# Patient Record
Sex: Female | Born: 1950 | Hispanic: No | Smoking: Never smoker
Health system: Southern US, Community
[De-identification: ages and names within clinical notes are randomized; demographics above are authoritative.]

---

## 2005-06-15 ENCOUNTER — Other Ambulatory Visit: Admission: RE | Admit: 2005-06-15 | Discharge: 2005-06-15 | Payer: Self-pay | Admitting: Family Medicine

## 2005-07-05 ENCOUNTER — Encounter: Admission: RE | Admit: 2005-07-05 | Discharge: 2005-07-05 | Payer: Self-pay | Admitting: Family Medicine

## 2006-07-31 ENCOUNTER — Encounter: Admission: RE | Admit: 2006-07-31 | Discharge: 2006-07-31 | Payer: Self-pay | Admitting: Family Medicine

## 2007-08-04 ENCOUNTER — Encounter: Admission: RE | Admit: 2007-08-04 | Discharge: 2007-08-04 | Payer: Self-pay | Admitting: Family Medicine

## 2008-09-15 ENCOUNTER — Encounter: Admission: RE | Admit: 2008-09-15 | Discharge: 2008-09-15 | Payer: Self-pay | Admitting: Family Medicine

## 2009-10-03 ENCOUNTER — Encounter: Admission: RE | Admit: 2009-10-03 | Discharge: 2009-10-03 | Payer: Self-pay | Admitting: Family Medicine

## 2010-10-06 ENCOUNTER — Encounter
Admission: RE | Admit: 2010-10-06 | Discharge: 2010-10-06 | Payer: Self-pay | Source: Home / Self Care | Attending: Family Medicine | Admitting: Family Medicine

## 2010-11-12 ENCOUNTER — Encounter: Payer: Self-pay | Admitting: Family Medicine

## 2011-09-05 ENCOUNTER — Other Ambulatory Visit: Payer: Self-pay | Admitting: Family Medicine

## 2011-09-05 DIAGNOSIS — Z1231 Encounter for screening mammogram for malignant neoplasm of breast: Secondary | ICD-10-CM

## 2011-10-08 ENCOUNTER — Ambulatory Visit
Admission: RE | Admit: 2011-10-08 | Discharge: 2011-10-08 | Disposition: A | Discharge status: Home / Self Care | Payer: No Typology Code available for payment source | Source: Ambulatory Visit | Attending: Family Medicine | Admitting: Family Medicine

## 2011-10-08 DIAGNOSIS — Z1231 Encounter for screening mammogram for malignant neoplasm of breast: Secondary | ICD-10-CM

## 2011-12-29 ENCOUNTER — Ambulatory Visit (INDEPENDENT_AMBULATORY_CARE_PROVIDER_SITE_OTHER): Payer: No Typology Code available for payment source | Admitting: Family Medicine

## 2011-12-29 VITALS — BP 114/61 | HR 80 | Temp 98.2°F | Resp 16 | Ht 64.5 in | Wt 132.0 lb

## 2011-12-29 DIAGNOSIS — L259 Unspecified contact dermatitis, unspecified cause: Secondary | ICD-10-CM

## 2011-12-29 DIAGNOSIS — L309 Dermatitis, unspecified: Secondary | ICD-10-CM

## 2011-12-29 MED ORDER — CEPHALEXIN 500 MG PO CAPS: 500.0000 mg | ORAL_CAPSULE | Freq: Three times a day (TID) | ORAL | Status: AC

## 2011-12-29 MED ORDER — PREDNISONE 20 MG PO TABS: ORAL_TABLET | ORAL | Status: AC

## 2011-12-29 MED ORDER — CLOBETASOL PROPIONATE 0.05 % EX CREA: TOPICAL_CREAM | Freq: Two times a day (BID) | CUTANEOUS | Status: DC

## 2011-12-29 NOTE — Patient Instructions (Addendum)
Only take brief showers, and then use some mineral oil as directed. Use the clobetasol cream once or twice daily, only in very small amounts. Take the oral medications as directed.  Return in one week  Eczema Atopic dermatitis, or eczema, is an inherited type of sensitive skin. Often people with eczema have a family history of allergies, asthma, or hay fever. It causes a red itchy rash and dry scaly skin. The itchiness may occur before the skin rash and may be very intense. It is not contagious. Eczema is generally worse during the cooler winter months and often improves with the warmth of summer. Eczema usually starts showing signs in infancy. Some children outgrow eczema, but it may last through adulthood. Flare-ups may be caused by:  Eating something or contact with something you are sensitive or allergic to.   Stress.  DIAGNOSIS  The diagnosis of eczema is usually based upon symptoms and medical history. TREATMENT  Eczema cannot be cured, but symptoms usually can be controlled with treatment or avoidance of allergens (things to which you are sensitive or allergic to).  Controlling the itching and scratching.   Use over-the-counter antihistamines as directed for itching. It is especially useful at night when the itching tends to be worse.   Use over-the-counter steroid creams as directed for itching.   Scratching makes the rash and itching worse and may cause impetigo (a skin infection) if fingernails are contaminated (dirty).   Keeping the skin well moisturized with creams every day. This will seal in moisture and help prevent dryness. Lotions containing alcohol and water can dry the skin and are not recommended.   Limiting exposure to allergens.   Recognizing situations that cause stress.   Developing a plan to manage stress.  HOME CARE INSTRUCTIONS   Take prescription and over-the-counter medicines as directed by your caregiver.   Do not use anything on the skin without  checking with your caregiver.   Keep baths or showers short (5 minutes) in warm (not hot) water. Use mild cleansers for bathing. You may add non-perfumed bath oil to the bath water. It is best to avoid soap and bubble bath.   Immediately after a bath or shower, when the skin is still damp, apply a moisturizing ointment to the entire body. This ointment should be a petroleum ointment. This will seal in moisture and help prevent dryness. The thicker the ointment the better. These should be unscented.   Keep fingernails cut short and wash hands often. If your child has eczema, it may be necessary to put soft gloves or mittens on your child at night.   Dress in clothes made of cotton or cotton blends. Dress lightly, as heat increases itching.   Avoid foods that may cause flare-ups. Common foods include cow's milk, peanut butter, eggs and wheat.   Keep a child with eczema away from anyone with fever blisters. The virus that causes fever blisters (herpes simplex) can cause a serious skin infection in children with eczema.  SEEK MEDICAL CARE IF:   Itching interferes with sleep.   The rash gets worse or is not better within one week following treatment.   The rash looks infected (pus or soft yellow scabs).   You or your child has an oral temperature above 102 F (38.9 C).   Your baby is older than 3 months with a rectal temperature of 100.5 F (38.1 C) or higher for more than 1 day.   The rash flares up after contact with someone  who has fever blisters.  SEEK IMMEDIATE MEDICAL CARE IF:   Your baby is older than 3 months with a rectal temperature of 102 F (38.9 C) or higher.   Your baby is older than 3 months or younger with a rectal temperature of 100.4 F (38 C) or higher.  Document Released: 10/05/2000 Document Revised: 09/27/2011 Document Reviewed: 08/10/2009 [facility] Patient Information 2012 [place], [place].

## 2011-12-29 NOTE — Progress Notes (Signed)
Subjective: Patient has had problems with eczema since she was a child in [place]. However it was much milder until the last several years. This is the worst it's been. In the last week it's gotten much worse. She has splotches of eczema on her back and trunk, on her extremities, especially bad on her shins. It itches her terribly. She's been using some triamcinolone cream without success. She tried soaking in the bathtub with some sea salt. Otherwise healthy.  Objective: Extensive eczema as noted above denies any other types of allergies.  Assessment eczema dermatitis  Plan: Prednisone Clobetasol Short shower, post shower mineral oil Keflex

## 2012-01-24 ENCOUNTER — Other Ambulatory Visit: Payer: Self-pay | Admitting: Family Medicine

## 2015-11-28 DIAGNOSIS — Z961 Presence of intraocular lens: Secondary | ICD-10-CM | POA: Diagnosis not present

## 2015-12-12 DIAGNOSIS — M654 Radial styloid tenosynovitis [de Quervain]: Secondary | ICD-10-CM | POA: Diagnosis not present

## 2015-12-12 DIAGNOSIS — M79642 Pain in left hand: Secondary | ICD-10-CM | POA: Diagnosis not present

## 2016-02-13 DIAGNOSIS — Z0000 Encounter for general adult medical examination without abnormal findings: Secondary | ICD-10-CM | POA: Diagnosis not present

## 2016-02-13 DIAGNOSIS — Z131 Encounter for screening for diabetes mellitus: Secondary | ICD-10-CM | POA: Diagnosis not present

## 2016-02-13 DIAGNOSIS — Z13820 Encounter for screening for osteoporosis: Secondary | ICD-10-CM | POA: Diagnosis not present

## 2016-02-13 DIAGNOSIS — Z1239 Encounter for other screening for malignant neoplasm of breast: Secondary | ICD-10-CM | POA: Diagnosis not present

## 2016-02-13 DIAGNOSIS — Z13220 Encounter for screening for lipoid disorders: Secondary | ICD-10-CM | POA: Diagnosis not present

## 2016-02-14 ENCOUNTER — Other Ambulatory Visit: Payer: Self-pay

## 2016-02-14 DIAGNOSIS — Z1231 Encounter for screening mammogram for malignant neoplasm of breast: Secondary | ICD-10-CM

## 2016-02-15 DIAGNOSIS — Z131 Encounter for screening for diabetes mellitus: Secondary | ICD-10-CM | POA: Diagnosis not present

## 2016-02-15 DIAGNOSIS — Z136 Encounter for screening for cardiovascular disorders: Secondary | ICD-10-CM | POA: Diagnosis not present

## 2016-03-01 ENCOUNTER — Ambulatory Visit
Admission: RE | Admit: 2016-03-01 | Discharge: 2016-03-01 | Disposition: A | Discharge status: Home / Self Care | Payer: Medicare Other | Source: Ambulatory Visit

## 2016-03-01 DIAGNOSIS — Z1231 Encounter for screening mammogram for malignant neoplasm of breast: Secondary | ICD-10-CM

## 2016-03-02 ENCOUNTER — Other Ambulatory Visit: Payer: Self-pay | Admitting: Physician Assistant

## 2016-03-02 DIAGNOSIS — R928 Other abnormal and inconclusive findings on diagnostic imaging of breast: Secondary | ICD-10-CM

## 2016-03-06 ENCOUNTER — Ambulatory Visit
Admission: RE | Admit: 2016-03-06 | Discharge: 2016-03-06 | Disposition: A | Discharge status: Home / Self Care | Payer: Medicare Other | Source: Ambulatory Visit | Attending: Physician Assistant | Admitting: Physician Assistant

## 2016-03-06 DIAGNOSIS — R928 Other abnormal and inconclusive findings on diagnostic imaging of breast: Secondary | ICD-10-CM

## 2016-03-06 DIAGNOSIS — R922 Inconclusive mammogram: Secondary | ICD-10-CM | POA: Diagnosis not present

## 2016-03-13 DIAGNOSIS — E2839 Other primary ovarian failure: Secondary | ICD-10-CM | POA: Diagnosis not present

## 2016-03-13 DIAGNOSIS — M8589 Other specified disorders of bone density and structure, multiple sites: Secondary | ICD-10-CM | POA: Diagnosis not present

## 2016-11-26 DIAGNOSIS — Z961 Presence of intraocular lens: Secondary | ICD-10-CM | POA: Diagnosis not present

## 2017-06-11 DIAGNOSIS — Z0000 Encounter for general adult medical examination without abnormal findings: Secondary | ICD-10-CM | POA: Diagnosis not present

## 2017-06-11 DIAGNOSIS — M85851 Other specified disorders of bone density and structure, right thigh: Secondary | ICD-10-CM | POA: Diagnosis not present

## 2017-06-11 DIAGNOSIS — E785 Hyperlipidemia, unspecified: Secondary | ICD-10-CM | POA: Diagnosis not present

## 2017-12-02 DIAGNOSIS — Z961 Presence of intraocular lens: Secondary | ICD-10-CM | POA: Diagnosis not present

## 2018-12-03 DIAGNOSIS — Z961 Presence of intraocular lens: Secondary | ICD-10-CM | POA: Diagnosis not present

## 2018-12-08 DIAGNOSIS — Z0000 Encounter for general adult medical examination without abnormal findings: Secondary | ICD-10-CM | POA: Diagnosis not present

## 2018-12-08 DIAGNOSIS — Z1389 Encounter for screening for other disorder: Secondary | ICD-10-CM | POA: Diagnosis not present

## 2019-02-07 ENCOUNTER — Emergency Department (HOSPITAL_BASED_OUTPATIENT_CLINIC_OR_DEPARTMENT_OTHER): Payer: Medicare PPO

## 2019-02-07 ENCOUNTER — Emergency Department (HOSPITAL_BASED_OUTPATIENT_CLINIC_OR_DEPARTMENT_OTHER)
Admission: EM | Admit: 2019-02-07 | Discharge: 2019-02-07 | Disposition: A | Discharge status: Home / Self Care | Payer: Medicare PPO | Attending: Emergency Medicine | Admitting: Emergency Medicine

## 2019-02-07 ENCOUNTER — Other Ambulatory Visit: Payer: Self-pay

## 2019-02-07 ENCOUNTER — Encounter (HOSPITAL_BASED_OUTPATIENT_CLINIC_OR_DEPARTMENT_OTHER): Payer: Self-pay | Admitting: *Deleted

## 2019-02-07 DIAGNOSIS — R0789 Other chest pain: Secondary | ICD-10-CM | POA: Insufficient documentation

## 2019-02-07 DIAGNOSIS — R1011 Right upper quadrant pain: Secondary | ICD-10-CM | POA: Insufficient documentation

## 2019-02-07 DIAGNOSIS — R079 Chest pain, unspecified: Secondary | ICD-10-CM | POA: Diagnosis not present

## 2019-02-07 DIAGNOSIS — Z79899 Other long term (current) drug therapy: Secondary | ICD-10-CM | POA: Insufficient documentation

## 2019-02-07 DIAGNOSIS — R1013 Epigastric pain: Secondary | ICD-10-CM | POA: Diagnosis not present

## 2019-02-07 LAB — URINALYSIS, MICROSCOPIC (REFLEX)

## 2019-02-07 LAB — URINALYSIS, ROUTINE W REFLEX MICROSCOPIC
Bilirubin Urine: NEGATIVE
Glucose, UA: NEGATIVE mg/dL
Ketones, ur: NEGATIVE mg/dL
Leukocytes,Ua: NEGATIVE
Nitrite: NEGATIVE
Protein, ur: NEGATIVE mg/dL
Specific Gravity, Urine: 1.01 (ref 1.005–1.030)
pH: 7 (ref 5.0–8.0)

## 2019-02-07 LAB — CBC WITH DIFFERENTIAL/PLATELET
Abs Immature Granulocytes: 0.02 10*3/uL (ref 0.00–0.07)
Basophils Absolute: 0 10*3/uL (ref 0.0–0.1)
Basophils Relative: 0 %
Eosinophils Absolute: 0 10*3/uL (ref 0.0–0.5)
Eosinophils Relative: 0 %
HCT: 42.6 % (ref 36.0–46.0)
Hemoglobin: 14 g/dL (ref 12.0–15.0)
Immature Granulocytes: 0 %
Lymphocytes Relative: 34 %
Lymphs Abs: 2.4 10*3/uL (ref 0.7–4.0)
MCH: 30.8 pg (ref 26.0–34.0)
MCHC: 32.9 g/dL (ref 30.0–36.0)
MCV: 93.8 fL (ref 80.0–100.0)
Monocytes Absolute: 0.4 10*3/uL (ref 0.1–1.0)
Monocytes Relative: 6 %
Neutro Abs: 4.2 10*3/uL (ref 1.7–7.7)
Neutrophils Relative %: 60 %
Platelets: 257 10*3/uL (ref 150–400)
RBC: 4.54 MIL/uL (ref 3.87–5.11)
RDW: 12.1 % (ref 11.5–15.5)
WBC: 7.1 10*3/uL (ref 4.0–10.5)
nRBC: 0 % (ref 0.0–0.2)

## 2019-02-07 LAB — TROPONIN I
Troponin I: <0.03 ng/mL (ref <0.03)
Troponin I: <0.03 ng/mL (ref <0.03)

## 2019-02-07 LAB — LIPASE, BLOOD: Lipase: 30 U/L (ref 11–51)

## 2019-02-07 LAB — COMPREHENSIVE METABOLIC PANEL
ALT: 21 U/L (ref 0–44)
AST: 20 U/L (ref 15–41)
Albumin: 4.2 g/dL (ref 3.5–5.0)
Alkaline Phosphatase: 77 U/L (ref 38–126)
Anion gap: 6 (ref 5–15)
BUN: 15 mg/dL (ref 8–23)
CO2: 27 mmol/L (ref 22–32)
Calcium: 9.1 mg/dL (ref 8.9–10.3)
Chloride: 105 mmol/L (ref 98–111)
Creatinine, Ser: 0.88 mg/dL (ref 0.44–1.00)
GFR calc Af Amer: >60 mL/min (ref >60)
GFR calc non Af Amer: >60 mL/min (ref >60)
Glucose, Bld: 116 mg/dL — ABNORMAL HIGH (ref 70–99)
Potassium: 3.1 mmol/L — ABNORMAL LOW (ref 3.5–5.1)
Sodium: 138 mmol/L (ref 135–145)
Total Bilirubin: 0.5 mg/dL (ref 0.3–1.2)
Total Protein: 7.3 g/dL (ref 6.5–8.1)

## 2019-02-07 MED ORDER — IOHEXOL 300 MG/ML  SOLN
100.0000 mL | Freq: Once | INTRAMUSCULAR | Status: AC | PRN
  Administered 2019-02-07: 17:00:00 100 mL via INTRAVENOUS

## 2019-02-07 MED ORDER — ESOMEPRAZOLE MAGNESIUM 40 MG PO CPDR: 40.0000 mg | DELAYED_RELEASE_CAPSULE | Freq: Every day | ORAL | 0 refills | Status: AC

## 2019-02-07 MED ORDER — ZOLPIDEM TARTRATE 5 MG PO TABS: 5.0000 mg | ORAL_TABLET | Freq: Every evening | ORAL | 0 refills | Status: AC | PRN

## 2019-02-07 MED ORDER — SODIUM CHLORIDE 0.9 % IV BOLUS
1000.0000 mL | Freq: Once | INTRAVENOUS | Status: AC
  Administered 2019-02-07: 1000 mL via INTRAVENOUS

## 2019-02-07 MED ORDER — ALUM & MAG HYDROXIDE-SIMETH 200-200-20 MG/5ML PO SUSP
30.0000 mL | Freq: Once | ORAL | Status: AC
  Administered 2019-02-07: 16:00:00 30 mL via ORAL
  Filled 2019-02-07: qty 30

## 2019-02-07 MED ORDER — LIDOCAINE VISCOUS HCL 2 % MT SOLN
15.0000 mL | Freq: Once | OROMUCOSAL | Status: AC
Start: 2019-02-07 — End: 2019-02-07
  Administered 2019-02-07: 16:00:00 15 mL via ORAL
  Filled 2019-02-07: qty 15

## 2019-02-07 MED ORDER — FAMOTIDINE IN NACL 20-0.9 MG/50ML-% IV SOLN
20.0000 mg | Freq: Once | INTRAVENOUS | Status: AC
  Administered 2019-02-07: 20 mg via INTRAVENOUS
  Filled 2019-02-07: qty 50

## 2019-02-07 NOTE — ED Notes (Signed)
Patient transported to CT 

## 2019-02-07 NOTE — ED Notes (Signed)
Patient transported to X-ray. Dr. Silverio Lay evaluated pt during triage

## 2019-02-07 NOTE — Discharge Instructions (Signed)
Take pepcid 20 mg twice daily as needed.   Take nexium daily.   You may have ambien as needed to help you sleep. DON'T take more than 1 per night   See GI doctor for follow up   Return to ER if you have worse abdominal pain, chest pain, trouble breathing.

## 2019-02-07 NOTE — ED Provider Notes (Signed)
[facility] EMERGENCY DEPARTMENT Provider Note   CSN: [id number] Arrival date & time: 02/07/19  1516    History   Chief Complaint Chief Complaint  Patient presents with   Chest Pain    HPI Kara Garrison is a 68 y.o. female also healthy here presenting with abdominal pain, chest pain.  Patient states that for the last 3 to 4 days, she has been having right upper quadrant pain.  States that the pain is sharp and radiates to her back and also radiates up her chest.  States that the pain is worse at night but denies any nausea or vomiting or fever or trouble breathing.  Patient denies eating any spicy or fatty foods and pain is not worsening after eating.  Patient states that her pain is currently 5 out of 10.  Patient went to [facility] clinic and was sent in for rule out ACS versus cholecystitis.      The history is provided by the patient.    History reviewed. No pertinent past medical history.  There are no active problems to display for this patient.   History reviewed. No pertinent surgical history.   OB History   No obstetric history on file.      Home Medications    Prior to Admission medications   Medication Sig Start Date End Date Taking? Authorizing Provider  Multiple Vitamin (MULTIVITAMIN) capsule Take 1 capsule by mouth daily.   Yes [provider]  VITAMIN D PO Take by mouth.   Yes [provider]  clobetasol cream (TEMOVATE) 0.05 % APPLY TOPICALLY 2 (TWO) TIMES DAILY. 01/24/12   Valarie Cones, Dema Severin, PA-C  predniSONE (DELTASONE) 20 MG tablet Takes 3 daily for 2 days, then daily for 2 days, then one daily for 2 days, then one half daily daily 12/29/11   Peyton Najjar, MD  triamcinolone cream (KENALOG) 0.1 % Apply topically 2 (two) times daily.    [provider]    Family History No family history on file.  Social History Social History   Tobacco Use   Smoking status: Never Smoker   Smokeless tobacco: Never Used  Substance Use  Topics   Alcohol use: Never    Frequency: Never   Drug use: Never     Allergies   Patient has no known allergies.   Review of Systems Review of Systems  Cardiovascular: Positive for chest pain.  All other systems reviewed and are negative.    Physical Exam Updated Vital Signs BP 139/82    Pulse 67    Temp 98.2 F (36.8 C) (Oral)    Resp (!) 23    Ht 5\' 5"  (1.651 m)    Wt 58.5 kg    SpO2 100%    BMI 21.47 kg/m   Physical Exam Vitals signs and nursing note reviewed.  Constitutional:      Appearance: She is well-developed.  HENT:     Head: Normocephalic.  Eyes:     Extraocular Movements: Extraocular movements intact.     Pupils: Pupils are equal, round, and reactive to light.  Neck:     Musculoskeletal: Normal range of motion and neck supple.  Cardiovascular:     Rate and Rhythm: Normal rate and regular rhythm.     Heart sounds: Normal heart sounds.  Pulmonary:     Effort: Pulmonary effort is normal.     Breath sounds: Normal breath sounds.  Abdominal:     Comments: + RUQ tenderness, ? Murphy sign. No  CVAT   Musculoskeletal: Normal range of motion.  Skin:    General: Skin is warm.     Capillary Refill: Capillary refill takes less than 2 seconds.  Neurological:     General: No focal deficit present.     Mental Status: She is alert and oriented to person, place, and time.  Psychiatric:        Mood and Affect: Mood normal.        Behavior: Behavior normal.      ED Treatments / Results  Labs (all labs ordered are listed, but only abnormal results are displayed) Labs Reviewed  COMPREHENSIVE METABOLIC PANEL - Abnormal; Notable for the following components:      Result Value   Potassium 3.1 (*)    Glucose, Bld 116 (*)    All other components within normal limits  URINALYSIS, ROUTINE W REFLEX MICROSCOPIC - Abnormal; Notable for the following components:   Hgb urine dipstick TRACE (*)    All other components within normal limits  URINALYSIS, MICROSCOPIC  (REFLEX) - Abnormal; Notable for the following components:   Bacteria, UA RARE (*)    All other components within normal limits  CBC WITH DIFFERENTIAL/PLATELET  TROPONIN I  LIPASE, BLOOD  TROPONIN I    EKG EKG Interpretation  Date/Time:  Saturday February 07 2019 15:24:58 EDT Ventricular Rate:  63 PR Interval:    QRS Duration: 95 QT Interval:  430 QTC Calculation: 441 R Axis:   67 Text Interpretation:  Sinus rhythm Low voltage, extremity leads Baseline wander in lead(s) V2 No previous ECGs available Confirmed by Richardean Canal [id number]) on 02/07/2019 4:12:23 PM   Radiology Dg Chest 2 View  Result Date: 02/07/2019 CLINICAL DATA:  68 year old female with 3 days of midsternal chest pain, worse at night. EXAM: CHEST - 2 VIEW COMPARISON:  None. FINDINGS: Lung volumes and mediastinal contours are within normal limits; mild tortuosity of the thoracic aorta. Visualized tracheal air column is within normal limits. No pneumothorax, pulmonary edema, pleural effusion or confluent pulmonary opacity. No acute osseous abnormality identified. Negative as well bowel-gas pattern. IMPRESSION: No acute cardiopulmonary abnormality. Electronically Signed   By: Odessa Fleming M.D.   On: 02/07/2019 16:04   Ct Abdomen Pelvis W Contrast  Result Date: 02/07/2019 CLINICAL DATA:  Epigastric pain, chest pain EXAM: CT ABDOMEN AND PELVIS WITH CONTRAST TECHNIQUE: Multidetector CT imaging of the abdomen and pelvis was performed using the standard protocol following bolus administration of intravenous contrast. CONTRAST:  OMNIPAQUE IOHEXOL 300 MG/ML  SOLN COMPARISON:  None. FINDINGS: Lower chest: No acute abnormality. Hepatobiliary: No focal liver abnormality is seen. No gallstones, gallbladder wall thickening, or biliary dilatation. Pancreas: Unremarkable. No pancreatic ductal dilatation or surrounding inflammatory changes. Spleen: Normal in size without focal abnormality. Adrenals/Urinary Tract: Adrenal glands are unremarkable.  Kidneys are normal, without renal calculi, focal lesion, or hydronephrosis. Bladder is unremarkable. Stomach/Bowel: Stomach is within normal limits. Appendix appears normal. No evidence of bowel wall thickening, distention, or inflammatory changes. Moderate amount of stool throughout the colon. Vascular/Lymphatic: No significant vascular findings are present. No enlarged abdominal or pelvic lymph nodes. Reproductive: Uterus and bilateral adnexa are unremarkable. Other: Small fat containing umbilical hernia. No abdominopelvic ascites. Musculoskeletal: No acute osseous abnormality. No aggressive osseous lesion. IMPRESSION: 1. No acute abdominal or pelvic pathology. Electronically Signed   By: Elige Ko   On: 02/07/2019 17:13   US Abdomen Limited Ruq  Result Date: 02/07/2019 CLINICAL DATA:  68 year old female with 3 days of chest, right  upper quadrant pain. EXAM: ULTRASOUND ABDOMEN LIMITED RIGHT UPPER QUADRANT COMPARISON:  Chest radiographs today. FINDINGS: Gallbladder: No gallstones or wall thickening visualized. No sonographic Murphy sign noted by sonographer. Common bile duct: Diameter: 2 millimeters, normal. Liver: No focal lesion identified. Within normal limits in parenchymal echogenicity. Portal vein is patent on color Doppler imaging with normal direction of blood flow towards the liver. Other findings: Negative visible right kidney. IMPRESSION: Normal right upper quadrant ultrasound. Electronically Signed   By: Odessa FlemingH  Hall M.D.   On: 02/07/2019 16:05    Procedures Procedures (including critical care time)  Medications Ordered in ED Medications  famotidine (PEPCID) IVPB 20 mg premix ( Intravenous Stopped 02/07/19 1619)  sodium chloride 0.9 % bolus 1,000 mL ( Intravenous Stopped 02/07/19 1717)  alum & mag hydroxide-simeth (MAALOX/MYLANTA) 200-200-20 MG/5ML suspension 30 mL (30 mLs Oral Given 02/07/19 1549)    And  lidocaine (XYLOCAINE) 2 % viscous mouth solution 15 mL (15 mLs Oral Given 02/07/19 1549)   iohexol (OMNIPAQUE) 300 MG/ML solution 100 mL (100 mLs Intravenous Contrast Given 02/07/19 1641)     Initial Impression / Assessment and Plan / ED Course  I have reviewed the triage vital signs and the nursing notes.  Pertinent labs & imaging results that were available during my care of the patient were reviewed by me and considered in my medical decision making (see chart for details).       Issa Sallyanne KusterGu is a 68 y.o. female here with epigastric, RUQ pain, chest pain. Consider biliary colic vs ACS vs gastritis. I doubt PE or dissection. Will get labs, RUQ US, trop x 2. Will give GI cocktail, pepcid.   6:40 PM Patient's RUQ US unremarkable. CT ab/pel unremarkable. Delta trop neg. Pain improved with pepcid, GI cocktail. Likely gastric ulcer vs gastritis. Will start on PPI. Patient not taking NSAIDs. Will refer to GI for endoscopy. Stable for discharge.   Final Clinical Impressions(s) / ED Diagnoses   Final diagnoses:  RUQ pain    ED Discharge Orders    None       Charlynne PanderYao, Puanani Gene Hsienta, MD 02/07/19 1843

## 2019-02-07 NOTE — ED Triage Notes (Signed)
Chest pain x 3 days, worse at night. Seen at [facility] walk-in and sent here for further eval. ?gallbladder

## 2019-02-09 DIAGNOSIS — R079 Chest pain, unspecified: Secondary | ICD-10-CM | POA: Diagnosis not present

## 2019-02-11 DIAGNOSIS — Z6822 Body mass index (BMI) 22.0-22.9, adult: Secondary | ICD-10-CM | POA: Diagnosis not present

## 2019-02-11 DIAGNOSIS — B029 Zoster without complications: Secondary | ICD-10-CM | POA: Diagnosis not present

## 2019-04-03 DIAGNOSIS — F5101 Primary insomnia: Secondary | ICD-10-CM | POA: Diagnosis not present

## 2019-12-13 ENCOUNTER — Ambulatory Visit: Payer: Medicare Other | Attending: Internal Medicine

## 2019-12-13 DIAGNOSIS — Z23 Encounter for immunization: Secondary | ICD-10-CM | POA: Insufficient documentation

## 2019-12-13 NOTE — Progress Notes (Signed)
   Covid-19 Vaccination Clinic  Name:  Avory Rahimi    MRN: [id number] DOB: [birth year]  12/13/2019  Ms. Fulp was observed post Covid-19 immunization for 15 minutes without incidence. She was provided with Vaccine Information Sheet and instruction to access the V-Safe system.   Ms. Fein was instructed to call 911 with any severe reactions post vaccine: Marland Kitchen Difficulty breathing  . Swelling of your face and throat  . A fast heartbeat  . A bad rash all over your body  . Dizziness and weakness    Immunizations Administered    Name Date Dose VIS Date Route   Pfizer COVID-19 Vaccine 12/13/2019 11:15 AM 0.3 mL 10/02/2019 Intramuscular   Manufacturer: [employer], [employer]   Lot: [id number]   NDC: [address]-0

## 2019-12-22 ENCOUNTER — Other Ambulatory Visit: Payer: Self-pay | Admitting: Family Medicine

## 2019-12-22 DIAGNOSIS — M8580 Other specified disorders of bone density and structure, unspecified site: Secondary | ICD-10-CM

## 2019-12-22 DIAGNOSIS — E2839 Other primary ovarian failure: Secondary | ICD-10-CM

## 2019-12-22 DIAGNOSIS — E559 Vitamin D deficiency, unspecified: Secondary | ICD-10-CM

## 2020-01-06 ENCOUNTER — Ambulatory Visit: Payer: Medicare Other | Attending: Internal Medicine

## 2020-01-06 DIAGNOSIS — Z23 Encounter for immunization: Secondary | ICD-10-CM

## 2020-01-06 NOTE — Progress Notes (Signed)
   Covid-19 Vaccination Clinic  Name:  Kara Garrison    MRN: [id number] DOB: Aug 12, 1951  01/06/2020  [name] was observed post Covid-19 immunization for 15 minutes without incident. She was provided with Vaccine Information Sheet and instruction to access the V-Safe system.   [name] was instructed to call 911 with any severe reactions post vaccine: Marland Kitchen Difficulty breathing  . Swelling of face and throat  . A fast heartbeat  . A bad rash all over body  . Dizziness and weakness   Immunizations Administered    Name Date Dose VIS Date Route   Pfizer COVID-19 Vaccine 01/06/2020  3:21 PM 0.3 mL 10/02/2019 Intramuscular   Manufacturer: [employer], [employer]   Lot: [id number]   NDC: [address]-1

## 2020-03-03 ENCOUNTER — Other Ambulatory Visit: Payer: Self-pay

## 2020-03-03 ENCOUNTER — Ambulatory Visit
Admission: RE | Admit: 2020-03-03 | Discharge: 2020-03-03 | Disposition: A | Discharge status: Home / Self Care | Payer: Medicare Other | Source: Ambulatory Visit | Attending: Family Medicine | Admitting: Family Medicine

## 2020-03-03 DIAGNOSIS — E2839 Other primary ovarian failure: Secondary | ICD-10-CM

## 2020-03-03 DIAGNOSIS — M8580 Other specified disorders of bone density and structure, unspecified site: Secondary | ICD-10-CM

## 2020-03-03 DIAGNOSIS — E559 Vitamin D deficiency, unspecified: Secondary | ICD-10-CM

## 2020-10-27 DIAGNOSIS — K3580 Unspecified acute appendicitis: Secondary | ICD-10-CM | POA: Diagnosis not present

## 2020-10-27 DIAGNOSIS — R1031 Right lower quadrant pain: Secondary | ICD-10-CM | POA: Diagnosis not present

## 2020-10-27 DIAGNOSIS — K353 Acute appendicitis with localized peritonitis, without perforation or gangrene: Secondary | ICD-10-CM | POA: Diagnosis not present

## 2020-10-28 DIAGNOSIS — K353 Acute appendicitis with localized peritonitis, without perforation or gangrene: Secondary | ICD-10-CM | POA: Diagnosis not present

## 2020-10-28 DIAGNOSIS — K37 Unspecified appendicitis: Secondary | ICD-10-CM | POA: Diagnosis not present

## 2020-10-28 DIAGNOSIS — R1031 Right lower quadrant pain: Secondary | ICD-10-CM | POA: Diagnosis not present

## 2020-10-28 DIAGNOSIS — K3580 Unspecified acute appendicitis: Secondary | ICD-10-CM | POA: Diagnosis not present

## 2020-11-15 IMAGING — US ULTRASOUND ABDOMEN LIMITED
1 series · 14 of 25 positions shown · non-contrast
Comparison: Chest radiographs today.

CLINICAL DATA: 67-year-old female with 3 days of chest, right upper
quadrant pain.

EXAM:
ULTRASOUND ABDOMEN LIMITED RIGHT UPPER QUADRANT

[Series 1: ultrasound abdomen limited · 14 of 33 slices shown]
[im 1/33]
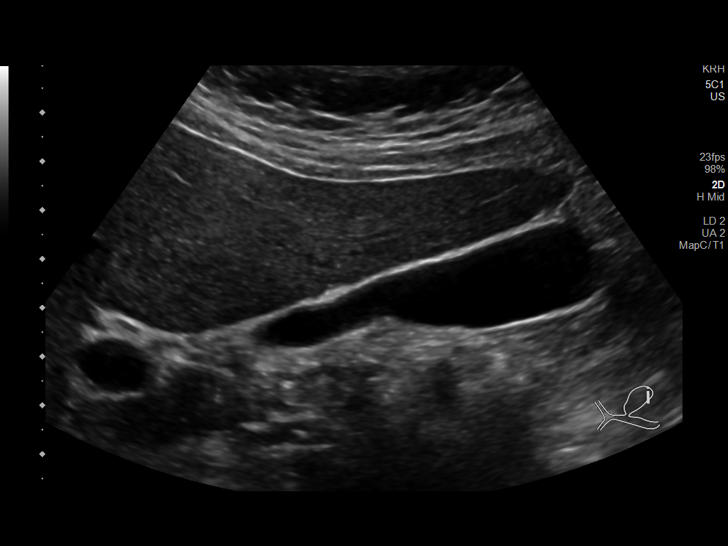
[im 3/33]
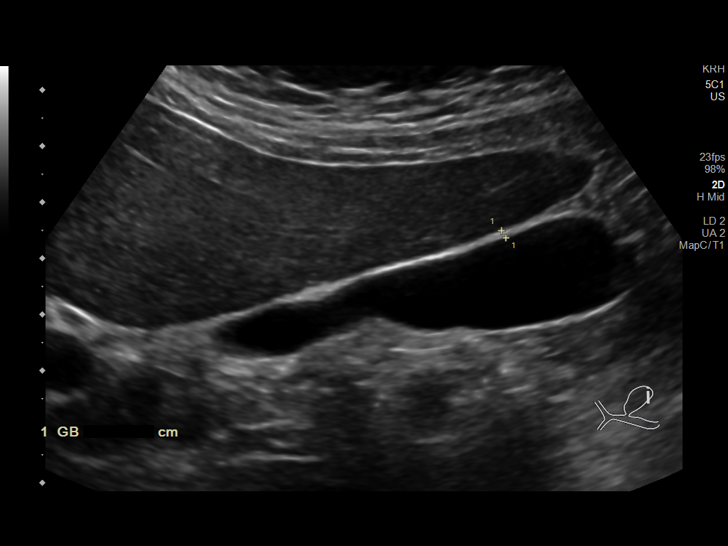
[im 6/33]
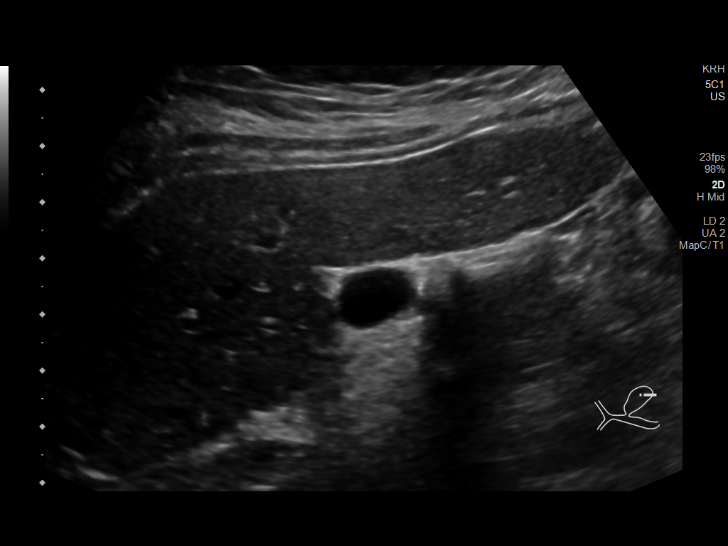
[im 9/33]
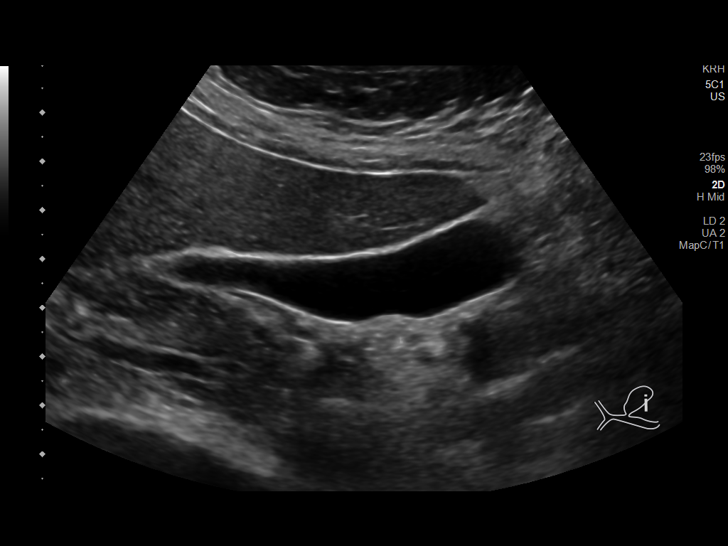
[im 11/33]
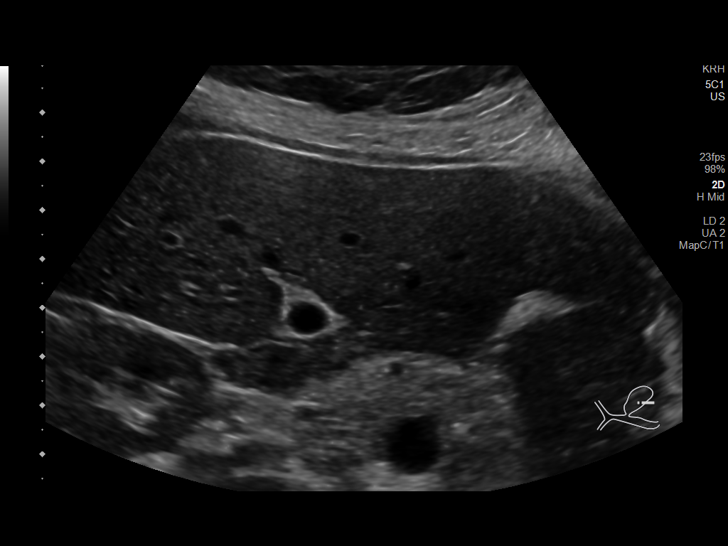
[im 13/33]
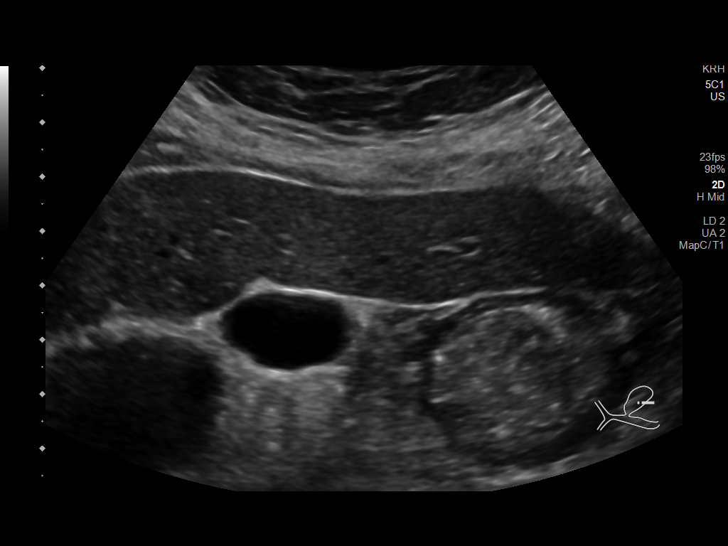
[im 15/33]
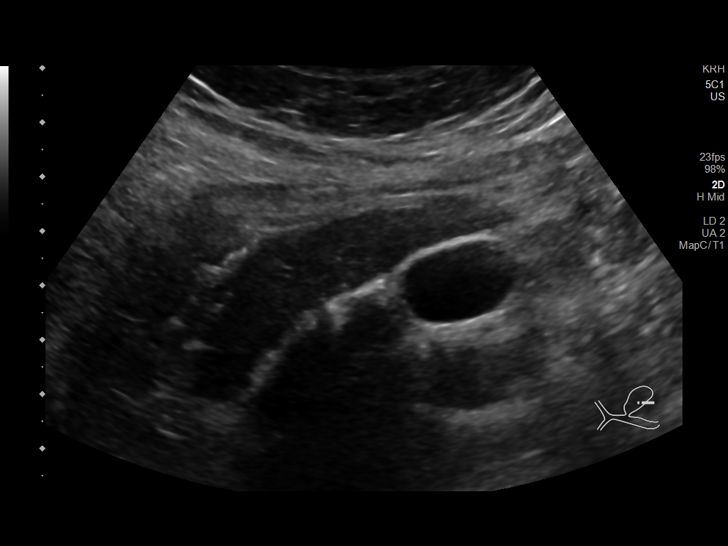
[im 18/33]
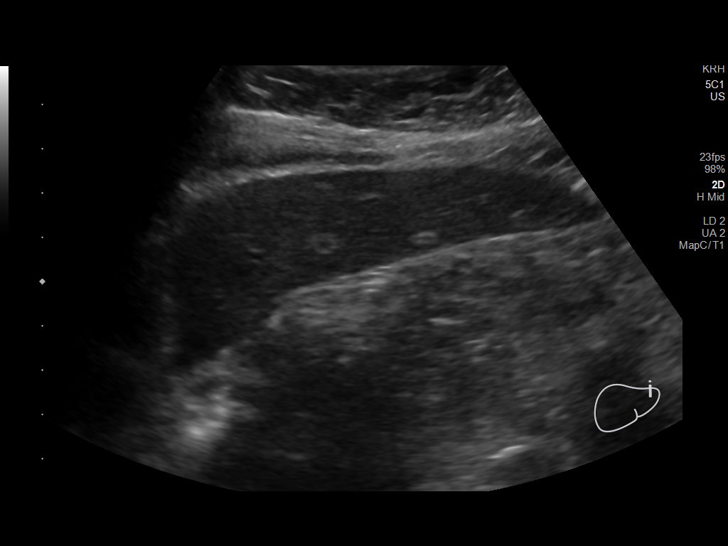
[im 21/33]
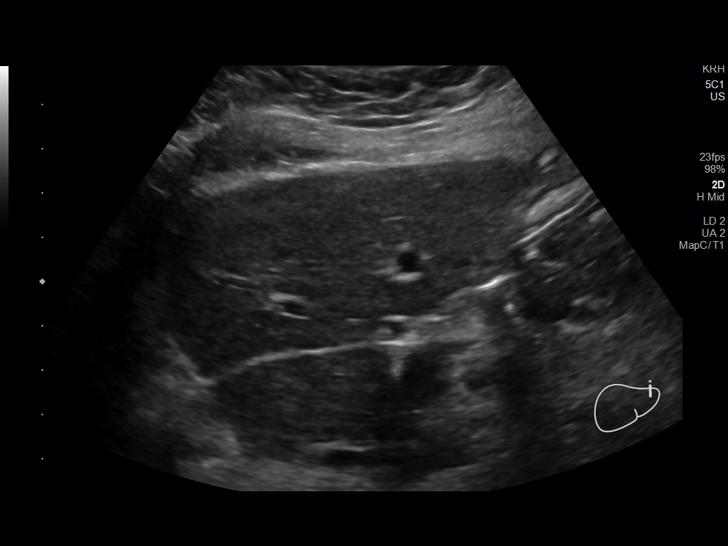
[im 22/33]
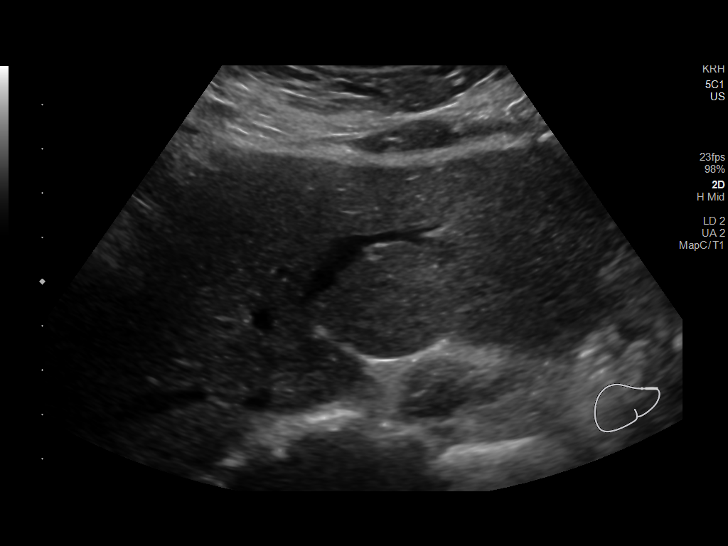
[im 25/33]
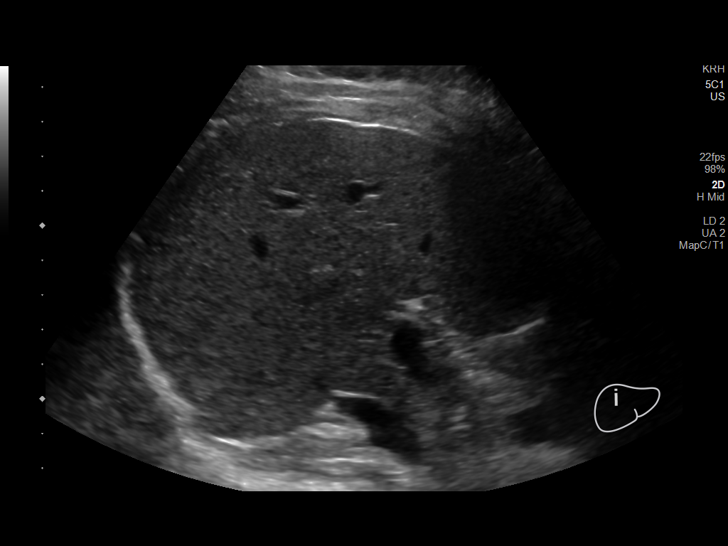
[im 27/33]
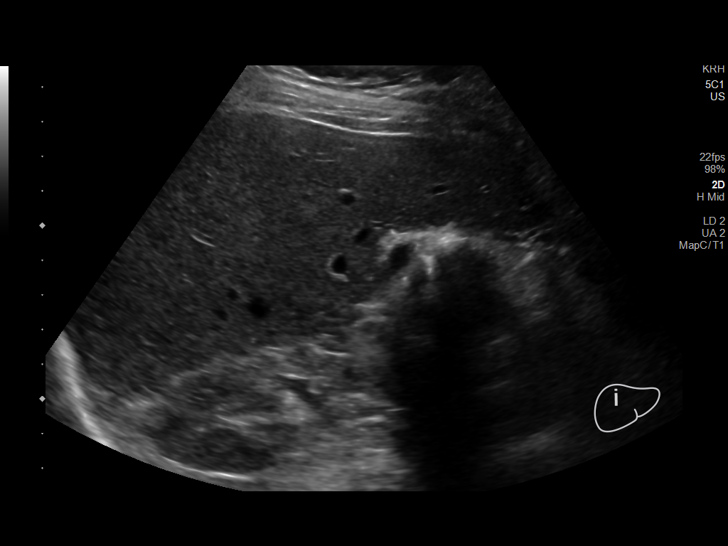
[im 30/33]
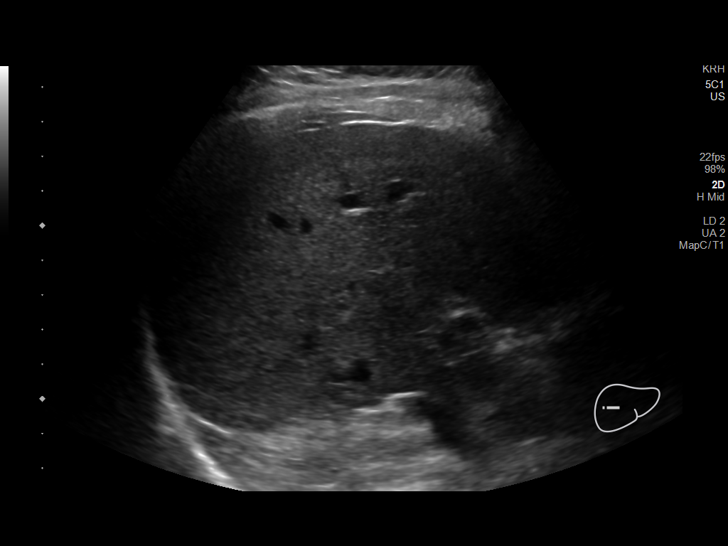
[im 33/33]
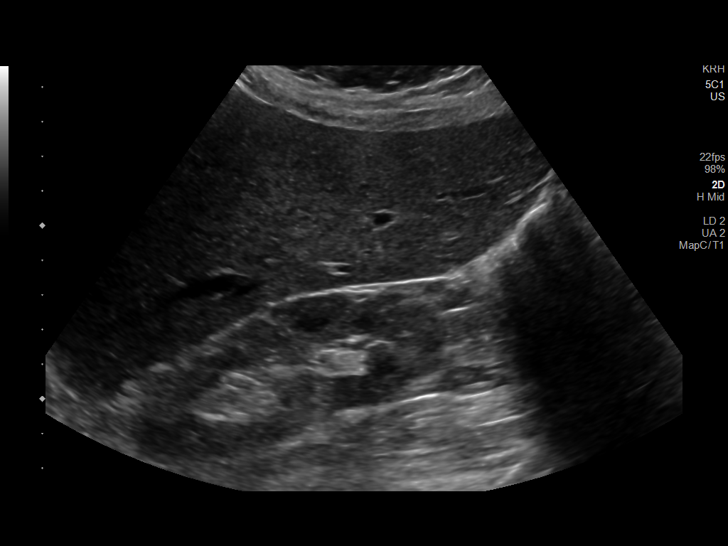

[14 of 25 positions shown; findings below may reference images not displayed]

FINDINGS: Gallbladder:

No gallstones or wall thickening visualized. No sonographic Murphy
sign noted by sonographer.

Common bile duct:

Diameter: 2 millimeters, normal.

Liver:

No focal lesion identified. Within normal limits in parenchymal
echogenicity. Portal vein is patent on color Doppler imaging with
normal direction of blood flow towards the liver.

Other findings: Negative visible right kidney.
IMPRESSION: Normal right upper quadrant ultrasound.

## 2020-12-06 DIAGNOSIS — Z961 Presence of intraocular lens: Secondary | ICD-10-CM | POA: Diagnosis not present

## 2020-12-22 DIAGNOSIS — E559 Vitamin D deficiency, unspecified: Secondary | ICD-10-CM | POA: Diagnosis not present

## 2020-12-22 DIAGNOSIS — M8580 Other specified disorders of bone density and structure, unspecified site: Secondary | ICD-10-CM | POA: Diagnosis not present

## 2020-12-22 DIAGNOSIS — Z0000 Encounter for general adult medical examination without abnormal findings: Secondary | ICD-10-CM | POA: Diagnosis not present

## 2020-12-22 DIAGNOSIS — E785 Hyperlipidemia, unspecified: Secondary | ICD-10-CM | POA: Diagnosis not present

## 2021-08-08 DIAGNOSIS — E559 Vitamin D deficiency, unspecified: Secondary | ICD-10-CM | POA: Diagnosis not present

## 2021-08-08 DIAGNOSIS — F5101 Primary insomnia: Secondary | ICD-10-CM | POA: Diagnosis not present

## 2021-08-08 DIAGNOSIS — M8580 Other specified disorders of bone density and structure, unspecified site: Secondary | ICD-10-CM | POA: Diagnosis not present

## 2021-08-08 DIAGNOSIS — E785 Hyperlipidemia, unspecified: Secondary | ICD-10-CM | POA: Diagnosis not present

## 2021-08-08 DIAGNOSIS — R220 Localized swelling, mass and lump, head: Secondary | ICD-10-CM | POA: Diagnosis not present

## 2021-12-07 DIAGNOSIS — Z961 Presence of intraocular lens: Secondary | ICD-10-CM | POA: Diagnosis not present

## 2022-01-23 DIAGNOSIS — E559 Vitamin D deficiency, unspecified: Secondary | ICD-10-CM | POA: Diagnosis not present

## 2022-01-23 DIAGNOSIS — F5101 Primary insomnia: Secondary | ICD-10-CM | POA: Diagnosis not present

## 2022-01-23 DIAGNOSIS — E785 Hyperlipidemia, unspecified: Secondary | ICD-10-CM | POA: Diagnosis not present

## 2022-01-23 DIAGNOSIS — Z0000 Encounter for general adult medical examination without abnormal findings: Secondary | ICD-10-CM | POA: Diagnosis not present

## 2022-01-23 DIAGNOSIS — M8580 Other specified disorders of bone density and structure, unspecified site: Secondary | ICD-10-CM | POA: Diagnosis not present

## 2022-08-07 DIAGNOSIS — F5101 Primary insomnia: Secondary | ICD-10-CM | POA: Diagnosis not present

## 2022-10-28 DIAGNOSIS — U071 COVID-19: Secondary | ICD-10-CM | POA: Diagnosis not present

## 2023-01-29 DIAGNOSIS — Z0000 Encounter for general adult medical examination without abnormal findings: Secondary | ICD-10-CM | POA: Diagnosis not present

## 2023-01-29 DIAGNOSIS — E559 Vitamin D deficiency, unspecified: Secondary | ICD-10-CM | POA: Diagnosis not present

## 2023-01-29 DIAGNOSIS — F5101 Primary insomnia: Secondary | ICD-10-CM | POA: Diagnosis not present

## 2023-01-29 DIAGNOSIS — M8580 Other specified disorders of bone density and structure, unspecified site: Secondary | ICD-10-CM | POA: Diagnosis not present

## 2023-01-29 DIAGNOSIS — E785 Hyperlipidemia, unspecified: Secondary | ICD-10-CM | POA: Diagnosis not present

## 2023-02-12 DIAGNOSIS — E559 Vitamin D deficiency, unspecified: Secondary | ICD-10-CM | POA: Diagnosis not present

## 2023-02-12 DIAGNOSIS — M8580 Other specified disorders of bone density and structure, unspecified site: Secondary | ICD-10-CM | POA: Diagnosis not present

## 2023-02-12 DIAGNOSIS — Z961 Presence of intraocular lens: Secondary | ICD-10-CM | POA: Diagnosis not present

## 2023-02-12 DIAGNOSIS — Z23 Encounter for immunization: Secondary | ICD-10-CM | POA: Diagnosis not present

## 2023-02-12 DIAGNOSIS — H524 Presbyopia: Secondary | ICD-10-CM | POA: Diagnosis not present

## 2023-02-12 DIAGNOSIS — E785 Hyperlipidemia, unspecified: Secondary | ICD-10-CM | POA: Diagnosis not present

## 2023-12-27 DIAGNOSIS — Z860100 Personal history of colon polyps, unspecified: Secondary | ICD-10-CM | POA: Diagnosis not present

## 2024-01-08 DIAGNOSIS — F5101 Primary insomnia: Secondary | ICD-10-CM | POA: Diagnosis not present

## 2024-01-23 DIAGNOSIS — Z09 Encounter for follow-up examination after completed treatment for conditions other than malignant neoplasm: Secondary | ICD-10-CM | POA: Diagnosis not present

## 2024-01-23 DIAGNOSIS — Z860101 Personal history of adenomatous and serrated colon polyps: Secondary | ICD-10-CM | POA: Diagnosis not present

## 2024-01-23 DIAGNOSIS — K6389 Other specified diseases of intestine: Secondary | ICD-10-CM | POA: Diagnosis not present

## 2024-01-23 DIAGNOSIS — D123 Benign neoplasm of transverse colon: Secondary | ICD-10-CM | POA: Diagnosis not present

## 2024-01-27 DIAGNOSIS — D123 Benign neoplasm of transverse colon: Secondary | ICD-10-CM | POA: Diagnosis not present

## 2024-06-11 DIAGNOSIS — F5101 Primary insomnia: Secondary | ICD-10-CM | POA: Diagnosis not present

## 2024-06-11 DIAGNOSIS — E785 Hyperlipidemia, unspecified: Secondary | ICD-10-CM | POA: Diagnosis not present

## 2024-06-11 DIAGNOSIS — M8580 Other specified disorders of bone density and structure, unspecified site: Secondary | ICD-10-CM | POA: Diagnosis not present

## 2024-06-11 DIAGNOSIS — Z0000 Encounter for general adult medical examination without abnormal findings: Secondary | ICD-10-CM | POA: Diagnosis not present

## 2024-06-11 DIAGNOSIS — E559 Vitamin D deficiency, unspecified: Secondary | ICD-10-CM | POA: Diagnosis not present
